# Patient Record
Sex: Female | Born: 1950 | Race: White | Hispanic: No
Health system: Midwestern US, Academic
[De-identification: ages and names within clinical notes are randomized; demographics above are authoritative.]

---

## 2016-12-11 ENCOUNTER — Ambulatory Visit: Admit: 2016-12-11 | Discharge: 2016-12-12 | Payer: MEDICARE | Primary: Family

## 2016-12-11 ENCOUNTER — Encounter: Admit: 2016-12-11 | Discharge: 2016-12-11 | Payer: MEDICARE | Primary: Family

## 2016-12-11 DIAGNOSIS — I10 Essential (primary) hypertension: ICD-10-CM

## 2016-12-11 DIAGNOSIS — E1122 Type 2 diabetes mellitus with diabetic chronic kidney disease: ICD-10-CM

## 2016-12-11 DIAGNOSIS — E669 Obesity, unspecified: Principal | ICD-10-CM

## 2016-12-11 DIAGNOSIS — IMO0001 IDDM (insulin dependent diabetes mellitus): ICD-10-CM

## 2016-12-11 DIAGNOSIS — R51 Headache: ICD-10-CM

## 2016-12-11 DIAGNOSIS — E785 Hyperlipidemia, unspecified: ICD-10-CM

## 2016-12-11 DIAGNOSIS — Z8659 Personal history of other mental and behavioral disorders: ICD-10-CM

## 2016-12-11 DIAGNOSIS — N289 Disorder of kidney and ureter, unspecified: ICD-10-CM

## 2016-12-11 DIAGNOSIS — R0789 Other chest pain: ICD-10-CM

## 2016-12-11 DIAGNOSIS — M109 Gout, unspecified: ICD-10-CM

## 2016-12-11 DIAGNOSIS — I4891 Unspecified atrial fibrillation: ICD-10-CM

## 2016-12-11 DIAGNOSIS — I4819 Other persistent atrial fibrillation: Principal | ICD-10-CM

## 2016-12-11 DIAGNOSIS — Z9889 Other specified postprocedural states: ICD-10-CM

## 2017-03-07 ENCOUNTER — Encounter: Admit: 2017-03-07 | Discharge: 2017-03-07 | Payer: MEDICARE | Primary: Family

## 2017-03-07 DIAGNOSIS — Z8659 Personal history of other mental and behavioral disorders: ICD-10-CM

## 2017-03-07 DIAGNOSIS — E785 Hyperlipidemia, unspecified: ICD-10-CM

## 2017-03-07 DIAGNOSIS — IMO0001 IDDM (insulin dependent diabetes mellitus): ICD-10-CM

## 2017-03-07 DIAGNOSIS — E669 Obesity, unspecified: Principal | ICD-10-CM

## 2017-03-07 DIAGNOSIS — Z9889 Other specified postprocedural states: ICD-10-CM

## 2017-03-07 DIAGNOSIS — M109 Gout, unspecified: ICD-10-CM

## 2017-03-07 DIAGNOSIS — I4891 Unspecified atrial fibrillation: ICD-10-CM

## 2017-03-07 DIAGNOSIS — R0789 Other chest pain: ICD-10-CM

## 2017-03-07 DIAGNOSIS — R51 Headache: ICD-10-CM

## 2017-03-07 DIAGNOSIS — I10 Essential (primary) hypertension: ICD-10-CM

## 2017-03-07 DIAGNOSIS — N289 Disorder of kidney and ureter, unspecified: ICD-10-CM

## 2017-03-08 ENCOUNTER — Encounter: Admit: 2017-03-08 | Discharge: 2017-03-08 | Payer: MEDICARE | Primary: Family

## 2017-03-08 DIAGNOSIS — E785 Hyperlipidemia, unspecified: ICD-10-CM

## 2017-03-08 DIAGNOSIS — IMO0001 IDDM (insulin dependent diabetes mellitus): ICD-10-CM

## 2017-03-08 DIAGNOSIS — N289 Disorder of kidney and ureter, unspecified: ICD-10-CM

## 2017-03-08 DIAGNOSIS — Z8659 Personal history of other mental and behavioral disorders: ICD-10-CM

## 2017-03-08 DIAGNOSIS — R51 Headache: ICD-10-CM

## 2017-03-08 DIAGNOSIS — Z9889 Other specified postprocedural states: ICD-10-CM

## 2017-03-08 DIAGNOSIS — I10 Essential (primary) hypertension: ICD-10-CM

## 2017-03-08 DIAGNOSIS — I4891 Unspecified atrial fibrillation: ICD-10-CM

## 2017-03-08 DIAGNOSIS — E669 Obesity, unspecified: Principal | ICD-10-CM

## 2017-03-08 DIAGNOSIS — M109 Gout, unspecified: ICD-10-CM

## 2017-03-08 DIAGNOSIS — R0789 Other chest pain: ICD-10-CM

## 2017-04-01 ENCOUNTER — Encounter: Admit: 2017-04-01 | Discharge: 2017-04-01 | Payer: MEDICARE | Primary: Family

## 2017-04-01 LAB — CBC
Lab: 4
Lab: 5.5

## 2017-04-08 ENCOUNTER — Encounter: Admit: 2017-04-08 | Discharge: 2017-04-08 | Payer: MEDICARE | Primary: Family

## 2017-04-08 DIAGNOSIS — R51 Headache: ICD-10-CM

## 2017-04-08 DIAGNOSIS — IMO0001 IDDM (insulin dependent diabetes mellitus): ICD-10-CM

## 2017-04-08 DIAGNOSIS — Z8659 Personal history of other mental and behavioral disorders: ICD-10-CM

## 2017-04-08 DIAGNOSIS — I4891 Unspecified atrial fibrillation: ICD-10-CM

## 2017-04-08 DIAGNOSIS — E785 Hyperlipidemia, unspecified: ICD-10-CM

## 2017-04-08 DIAGNOSIS — E669 Obesity, unspecified: Principal | ICD-10-CM

## 2017-04-08 DIAGNOSIS — M109 Gout, unspecified: ICD-10-CM

## 2017-04-08 DIAGNOSIS — N289 Disorder of kidney and ureter, unspecified: ICD-10-CM

## 2017-04-08 DIAGNOSIS — Z9889 Other specified postprocedural states: ICD-10-CM

## 2017-04-08 DIAGNOSIS — I10 Essential (primary) hypertension: ICD-10-CM

## 2017-04-08 DIAGNOSIS — R0789 Other chest pain: ICD-10-CM

## 2017-06-22 ENCOUNTER — Encounter: Admit: 2017-06-22 | Discharge: 2017-06-22 | Payer: MEDICARE | Primary: Adult Health

## 2017-06-25 ENCOUNTER — Ambulatory Visit: Admit: 2017-06-25 | Discharge: 2017-06-25 | Payer: MEDICARE | Primary: Family

## 2017-06-25 ENCOUNTER — Encounter: Admit: 2017-06-25 | Discharge: 2017-06-25 | Payer: MEDICARE | Primary: Adult Health

## 2017-06-25 DIAGNOSIS — E669 Obesity, unspecified: Principal | ICD-10-CM

## 2017-06-25 DIAGNOSIS — E785 Hyperlipidemia, unspecified: ICD-10-CM

## 2017-06-25 DIAGNOSIS — R0789 Other chest pain: ICD-10-CM

## 2017-06-25 DIAGNOSIS — I10 Essential (primary) hypertension: ICD-10-CM

## 2017-06-25 DIAGNOSIS — M109 Gout, unspecified: ICD-10-CM

## 2017-06-25 DIAGNOSIS — Z9889 Other specified postprocedural states: ICD-10-CM

## 2017-06-25 DIAGNOSIS — IMO0001 IDDM (insulin dependent diabetes mellitus): ICD-10-CM

## 2017-06-25 DIAGNOSIS — I4891 Unspecified atrial fibrillation: ICD-10-CM

## 2017-06-25 DIAGNOSIS — I4819 Other persistent atrial fibrillation: Principal | ICD-10-CM

## 2017-06-25 DIAGNOSIS — R51 Headache: ICD-10-CM

## 2017-06-25 DIAGNOSIS — N289 Disorder of kidney and ureter, unspecified: ICD-10-CM

## 2017-06-25 DIAGNOSIS — Z8659 Personal history of other mental and behavioral disorders: ICD-10-CM

## 2017-06-28 ENCOUNTER — Encounter: Admit: 2017-06-28 | Discharge: 2017-06-28 | Payer: MEDICARE | Primary: Family

## 2017-11-17 ENCOUNTER — Encounter: Admit: 2017-11-17 | Discharge: 2017-11-17 | Payer: MEDICARE | Primary: Family

## 2017-11-17 DIAGNOSIS — R51 Headache: ICD-10-CM

## 2017-11-17 DIAGNOSIS — N289 Disorder of kidney and ureter, unspecified: ICD-10-CM

## 2017-11-17 DIAGNOSIS — I4891 Unspecified atrial fibrillation: ICD-10-CM

## 2017-11-17 DIAGNOSIS — Z8659 Personal history of other mental and behavioral disorders: ICD-10-CM

## 2017-11-17 DIAGNOSIS — R0789 Other chest pain: ICD-10-CM

## 2017-11-17 DIAGNOSIS — IMO0001 IDDM (insulin dependent diabetes mellitus): ICD-10-CM

## 2017-11-17 DIAGNOSIS — Z9889 Other specified postprocedural states: ICD-10-CM

## 2017-11-17 DIAGNOSIS — M109 Gout, unspecified: ICD-10-CM

## 2017-11-17 DIAGNOSIS — E669 Obesity, unspecified: Principal | ICD-10-CM

## 2017-11-17 DIAGNOSIS — I10 Essential (primary) hypertension: ICD-10-CM

## 2017-11-17 DIAGNOSIS — E785 Hyperlipidemia, unspecified: ICD-10-CM

## 2017-12-06 ENCOUNTER — Encounter: Admit: 2017-12-06 | Discharge: 2017-12-06 | Payer: MEDICARE | Primary: Family

## 2017-12-06 ENCOUNTER — Ambulatory Visit: Admit: 2017-12-06 | Discharge: 2017-12-07 | Payer: MEDICARE | Primary: Family

## 2017-12-06 DIAGNOSIS — Z8659 Personal history of other mental and behavioral disorders: ICD-10-CM

## 2017-12-06 DIAGNOSIS — R0789 Other chest pain: ICD-10-CM

## 2017-12-06 DIAGNOSIS — E1122 Type 2 diabetes mellitus with diabetic chronic kidney disease: ICD-10-CM

## 2017-12-06 DIAGNOSIS — I10 Essential (primary) hypertension: ICD-10-CM

## 2017-12-06 DIAGNOSIS — IMO0001 IDDM (insulin dependent diabetes mellitus): ICD-10-CM

## 2017-12-06 DIAGNOSIS — I4819 Other persistent atrial fibrillation: Principal | ICD-10-CM

## 2017-12-06 DIAGNOSIS — E669 Obesity, unspecified: Principal | ICD-10-CM

## 2017-12-06 DIAGNOSIS — E785 Hyperlipidemia, unspecified: ICD-10-CM

## 2017-12-06 DIAGNOSIS — R51 Headache: ICD-10-CM

## 2017-12-06 DIAGNOSIS — N289 Disorder of kidney and ureter, unspecified: ICD-10-CM

## 2017-12-06 DIAGNOSIS — M109 Gout, unspecified: ICD-10-CM

## 2017-12-06 DIAGNOSIS — I4891 Unspecified atrial fibrillation: ICD-10-CM

## 2017-12-06 DIAGNOSIS — Z9889 Other specified postprocedural states: ICD-10-CM

## 2017-12-08 ENCOUNTER — Encounter: Admit: 2017-12-08 | Discharge: 2017-12-08 | Payer: MEDICARE | Primary: Adult Health

## 2017-12-08 ENCOUNTER — Encounter: Admit: 2017-12-08 | Discharge: 2017-12-08 | Payer: MEDICARE | Primary: Family

## 2017-12-13 ENCOUNTER — Encounter: Admit: 2017-12-13 | Discharge: 2017-12-13 | Payer: MEDICARE | Primary: Family

## 2017-12-13 DIAGNOSIS — E785 Hyperlipidemia, unspecified: ICD-10-CM

## 2017-12-13 DIAGNOSIS — R0789 Other chest pain: ICD-10-CM

## 2017-12-13 DIAGNOSIS — R51 Headache: ICD-10-CM

## 2017-12-13 DIAGNOSIS — I10 Essential (primary) hypertension: ICD-10-CM

## 2017-12-13 DIAGNOSIS — N289 Disorder of kidney and ureter, unspecified: ICD-10-CM

## 2017-12-13 DIAGNOSIS — E669 Obesity, unspecified: Principal | ICD-10-CM

## 2017-12-13 DIAGNOSIS — Z9889 Other specified postprocedural states: ICD-10-CM

## 2017-12-13 DIAGNOSIS — Z8659 Personal history of other mental and behavioral disorders: ICD-10-CM

## 2017-12-13 DIAGNOSIS — IMO0001 IDDM (insulin dependent diabetes mellitus): ICD-10-CM

## 2017-12-13 DIAGNOSIS — M109 Gout, unspecified: ICD-10-CM

## 2017-12-13 DIAGNOSIS — I4891 Unspecified atrial fibrillation: ICD-10-CM

## 2018-06-22 ENCOUNTER — Encounter: Admit: 2018-06-22 | Discharge: 2018-06-22 | Primary: Family

## 2018-06-22 NOTE — Telephone Encounter
-----   Message from Louis Meckel, LPN sent at 9/32/3557  1:27 PM CDT -----  Regarding: YMR- having surgery  VM from patient on triage line.  1- she is having cataract surgery next month.  Is there any medications she needs to hold?  2- her PCP would like to put her on Prolia for bone density.  Is it safe for her to take?  Call her at [phone].

## 2018-06-22 NOTE — Telephone Encounter
Called and spoke with patient. Patient stated they will see their eye doctor on 6/20 for workup for cataract surgery. Wanting to know how long ok to hold blood thinner. Informed to patient to have their office send [place] a medication hold form if it is needed. Gave patient our fax number to send request to. Told patient we would confirm on our end if you have to hold blood thinners what the appropriate time frame would be. Patient also asking about starting on medication prolia. Patient concerned due to side effect reporting risk of infection to the heart. Told patient to tell Dr. Reece Levy at next [place] on 7/29. Patient verbalized understanding. No further questions.

## 2018-06-23 NOTE — Telephone Encounter
Tina Gerold, APRN-NP  [place], [place], BSN            Should be fine as long as no hx TIA/CVA.     Thanks,   Tina Bush    Previous Messages      ----- Message -----   From: Tina Bush, BSN   Sent: 06/22/2018  2:47 PM CDT   To: Tina Bush Ep App   Subject: Medication hold for Blood thinner           Patient will be having Cataract Surgery next month. Patient unsure of procedure yet.     Patient is currently on Xarelto for PAF.   At his last OV, his heart rhythm was NSR, OV 12/06/2017 with YMR gave Cardiac clearance and med hold clearance for knee surgery.   Chads2vasc = 4 (HTN, Age, Gender, DM).   In past, patient has been OK to hold Xarelto without bridge for 48 hours.      Is patient OK to hold Xarelto for 2 days?   Does patient need to bridge?

## 2018-07-25 ENCOUNTER — Encounter: Admit: 2018-07-25 | Discharge: 2018-07-25 | Primary: Family

## 2018-08-03 ENCOUNTER — Encounter: Admit: 2018-08-03 | Discharge: 2018-08-03 | Primary: Family

## 2018-08-03 ENCOUNTER — Ambulatory Visit: Admit: 2018-08-03 | Discharge: 2018-08-04 | Primary: Family

## 2018-08-03 DIAGNOSIS — I480 Paroxysmal atrial fibrillation: Secondary | ICD-10-CM

## 2018-08-03 DIAGNOSIS — Z9889 Other specified postprocedural states: Secondary | ICD-10-CM

## 2018-08-03 DIAGNOSIS — E785 Hyperlipidemia, unspecified: Secondary | ICD-10-CM

## 2018-08-03 DIAGNOSIS — E669 Obesity, unspecified: Secondary | ICD-10-CM

## 2018-08-03 DIAGNOSIS — N289 Disorder of kidney and ureter, unspecified: Secondary | ICD-10-CM

## 2018-08-03 DIAGNOSIS — I4891 Unspecified atrial fibrillation: Secondary | ICD-10-CM

## 2018-08-03 DIAGNOSIS — R0789 Other chest pain: Secondary | ICD-10-CM

## 2018-08-03 DIAGNOSIS — R51 Headache: Secondary | ICD-10-CM

## 2018-08-03 DIAGNOSIS — I10 Essential (primary) hypertension: Secondary | ICD-10-CM

## 2018-08-03 DIAGNOSIS — IMO0001 IDDM (insulin dependent diabetes mellitus): Secondary | ICD-10-CM

## 2018-08-03 DIAGNOSIS — M109 Gout, unspecified: Secondary | ICD-10-CM

## 2018-08-03 DIAGNOSIS — Z8659 Personal history of other mental and behavioral disorders: Secondary | ICD-10-CM

## 2018-08-03 DIAGNOSIS — I4819 Other persistent atrial fibrillation: Secondary | ICD-10-CM

## 2018-08-03 DIAGNOSIS — E6609 Other obesity due to excess calories: Secondary | ICD-10-CM

## 2018-08-03 NOTE — Patient Instructions
Things look great today - no changes.  Return to see Dr. Reece Levy in 1 year.  Call us sooner for new or worsening symptoms

## 2018-08-03 NOTE — Progress Notes
Date of Service: 08/03/2018    Tina Bush is a 68 y.o. female.       HPI  Tina Bush was seen in the office today in electrophysiology follow up. As you may know, she is a 68 y.o. female, with past medical history including pretension, hyperlipidemia, diabetes mellitus type 2, obesity and persistent atrial fibrillation.  She did undergo pulmonary vein isolation with Dr. Wyona Almas in 2016 and she did require redo ovarian vein isolation and cavotricuspid isthmus ablation in 2017.  Since that time she has done well without known arrhythmia recurrence off of antiarrhythmic therapy.  She remains on  anticoagulation in the form of Xarelto given her CHA2DS2-VASc Score of 4 (age, female, DM, HTN).  She last saw Dr. Betti Cruz in December prior to undergoing knee replacement surgery.  She presents today for routine follow-up.    [name] reports feeling well since her last visit.  She did undergo knee replacement surgery earlier this year without complications.  She denies any known recurrence of atrial arrhythmias, however states that she does not typically feel her symptoms when out of rhythm.  She states that she is followed her primary care provider on a monthly basis.  She denies any exertional dyspnea or chest discomfort.  She denies dizziness, lightheadedness, syncope or near syncope.    __________________________________________________________________________________  DISEASE PREVENTION:   Lipids/Statin treatment:  yes.   Currently on atorvastatin   for Goal LDL < 70, Triglycerides < 161 mg/dl.     HTN: yes.  Optimally controlled on current medical therapy.  Goal Systolic < 130, diastolic < 90.       Diabetes:  yes.  Follows with PCP for goal A1c < 6.0    Tobacco: Denies          Obesity/Nutrition/Physical Activity: BMI 35  __________________________________________________________________________________      Vitals:    08/03/18 1506 08/03/18 1524   BP: 122/84 128/86 BP Source: Arm, Left Upper Arm, Right Upper   Pulse: 64    Temp: 36.6 ???C (97.9 ???F)    SpO2: 97%    Weight: 94.6 kg (208 lb 8 oz)    Height: 1.651 m (5' 5)    PainSc: Zero      Body mass index is 34.7 kg/m???.     Past Medical History  Patient Active Problem List    Diagnosis Date Noted   ??? S/P ablation of atrial fibrillation 08/03/2018   ??? Atrial fibrillation (HCC) 05/31/2015   ??? Obesity 02/25/2012   ??? Diabetes mellitus (HCC) 02/25/2012   ??? Dyslipidemia 02/25/2012   ??? Chest pressure 02/25/2012   ??? Persistent atrial fibrillation (HCC) 02/25/2012         Review of Systems   Constitution: Negative.   HENT: Negative.    Eyes: Positive for blurred vision.   Cardiovascular: Positive for dyspnea on exertion and leg swelling.   Respiratory: Positive for snoring.    Endocrine: Positive for polyuria.   Hematologic/Lymphatic: Negative.    Skin: Negative.    Musculoskeletal: Positive for arthritis, joint pain, joint swelling, muscle cramps and stiffness.   Gastrointestinal: Negative.    Genitourinary: Positive for frequency.        Kidney disease   Neurological: Positive for headaches.   Psychiatric/Behavioral: Positive for depression. The patient has insomnia.    Allergic/Immunologic: Positive for environmental allergies.       Physical Exam   Constitutional: She appears well-developed and well-nourished.   HENT:   Head: Normocephalic and  atraumatic.   Eyes: Conjunctivae are normal.   Neck:   Soft carotid bruit auscultated on the left   Cardiovascular: Normal rate, regular rhythm and normal heart sounds.   Pulmonary/Chest: Effort normal and breath sounds normal.   Musculoskeletal:         General: No edema.   Neurological: She is alert and oriented to person, place, and time.   Skin: Skin is warm and dry.   Psychiatric: She has a normal mood and affect. Her behavior is normal.     Cardiovascular Studies  ECG in office today shows normal sinus rhythm at 53 bpm with right bundle branch block.  QRS duration estimated near 130 ms Problems Addressed Today  Encounter Diagnoses   Name Primary?   ??? PAF (paroxysmal atrial fibrillation) (HCC) Yes   ??? Persistent atrial fibrillation (HCC)    ??? Other persistent atrial fibrillation (HCC)    ??? Class 1 obesity due to excess calories without serious comorbidity with body mass index (BMI) of 34.0 to 34.9 in adult    ??? S/P ablation of atrial fibrillation        Assessment and Plan  Ms. Tina Bush appears to be doing well since her last visit 6 months ago.  She underwent knee replacement surgery without any known arrhythmia recurrence.  She remains off of antiarrhythmic drug therapy but continues on anticoagulation given her CHA2DS2-VASc Score of 4.  She does plan to undergo eye surgery in the near future and I told her that it would be fine for her to come off of anticoagulation for several days before and after procedure without bridging.    She does have a soft left carotid bruit upon auscultation in the office today.  She states that her primary provider is aware and that she has had a carotid ultrasound done [facility] in the past revealing no significant stenosis.  I have asked that she continue to follow with them in regards to this.    I have arranged for her to see Dr. Betti Cruz back in 1 years time barring any changes in the interim.  I have asked that she contact us sooner for worsening of symptoms or new complaints.  She verbalized good understanding and agreement with this plan.    Jene Every, NP-C     Current Medications (including today's revisions)  ??? allopurinol (ZYLOPRIM) 100 mg tablet Take 100 mg by mouth daily.   ??? atenolol (TENORMIN) 25 mg tablet Take 25 mg by mouth daily.   ??? atorvastatin (LIPITOR) 40 mg tablet Take 40 mg by mouth daily.   ??? biotin 1 mg cap Take 10,000 mg by mouth daily.   ??? CALCIUM CARBONATE/VITAMIN D2 (CALCIUM + VITAMIN D PO) Take 2 Tabs by mouth daily.   ??? cholecalciferol (VITAMIN D3) 50 mcg (2,000 unit) tablet Take 2,000 Units by mouth daily. ??? denosumab (PROLIA) 60 mg/mL syrg Inject 60 mg under the skin every 180 days.   ??? fexofenadine (ALLEGRA) 180 mg tablet Take 180 mg by mouth daily.   ??? fluoxetine (PROZAC) 20 mg capsule Take 20 mg by mouth daily.   ??? furosemide (LASIX) 20 mg tablet Take 20 mg by mouth every morning.   ??? Gelatin 650 mg cap Take 2 Caps by mouth daily.   ??? glipiZIDE (GLUCOTROL) 5 mg tablet Take 2.5 mg by mouth daily.   ??? hydroCHLOROthiazide (HYDRODIURIL) 12.5 mg tablet Take 12.5 mg by mouth daily.   ??? lisinopril (PRINIVIL, ZESTRIL) 40 mg tablet Take 40 mg  by mouth daily.   ??? rivaroxaban (XARELTO) 20 mg tab tablet Take 20 mg by mouth at bedtime daily.   ??? vitamin E 400 unit capsule Take 400 Units by mouth daily.   ??? vitamins, multiple tablet Take 1 Tab by mouth daily.

## 2018-08-04 DIAGNOSIS — Z9889 Other specified postprocedural states: Secondary | ICD-10-CM

## 2018-08-04 DIAGNOSIS — Z6834 Body mass index (BMI) 34.0-34.9, adult: Secondary | ICD-10-CM

## 2019-04-14 ENCOUNTER — Encounter: Admit: 2019-04-14 | Discharge: 2019-04-14 | Payer: MEDICARE | Primary: Family

## 2019-04-14 NOTE — Telephone Encounter
Called patient.   Scheduled her to see Dr. Reece Levy.   She also wanted to let us know that she got her first COVID vaccine yesterday.

## 2019-04-14 NOTE — Telephone Encounter
-----   Message from Louis Meckel, LPN sent at [id number]  3:49 PM CDT -----  Regarding: YMR- update her med list and make appointment. Call her at [phone].

## 2019-04-14 NOTE — Telephone Encounter
-----   Message from Louis Meckel, LPN sent at [id number]  3:49 PM CDT -----  Regarding: YMR- update her med list and make appointment. Call her at [phone].

## 2019-06-30 ENCOUNTER — Encounter: Admit: 2019-06-30 | Discharge: 2019-06-30 | Payer: MEDICARE | Primary: Family

## 2019-06-30 NOTE — Progress Notes
Please send the following records for continuity of care.     Most recent lipid and liver profile, chemistry panel, and thyroid panel.     Please fax to:  Attention    Fax: [phone]    Thank you,     [facility]  Cardiovascular Medicine  [address] [place] [address]  Phone: [phone]  Fax: [phone]

## 2019-07-07 ENCOUNTER — Encounter: Admit: 2019-07-07 | Discharge: 2019-07-07 | Payer: MEDICARE | Primary: Family

## 2019-07-07 ENCOUNTER — Ambulatory Visit: Admit: 2019-07-07 | Discharge: 2019-07-07 | Payer: MEDICARE | Primary: Family

## 2019-07-07 DIAGNOSIS — R519 Generalized headache: Secondary | ICD-10-CM

## 2019-07-07 DIAGNOSIS — M109 Gout, unspecified: Secondary | ICD-10-CM

## 2019-07-07 DIAGNOSIS — I4891 Unspecified atrial fibrillation: Secondary | ICD-10-CM

## 2019-07-07 DIAGNOSIS — IMO0001 IDDM (insulin dependent diabetes mellitus): Secondary | ICD-10-CM

## 2019-07-07 DIAGNOSIS — I4819 Other persistent atrial fibrillation: Secondary | ICD-10-CM

## 2019-07-07 DIAGNOSIS — I10 Essential (primary) hypertension: Secondary | ICD-10-CM

## 2019-07-07 DIAGNOSIS — R0789 Other chest pain: Secondary | ICD-10-CM

## 2019-07-07 DIAGNOSIS — E785 Hyperlipidemia, unspecified: Secondary | ICD-10-CM

## 2019-07-07 DIAGNOSIS — Z9889 Other specified postprocedural states: Secondary | ICD-10-CM

## 2019-07-07 DIAGNOSIS — E669 Obesity, unspecified: Secondary | ICD-10-CM

## 2019-07-07 DIAGNOSIS — Z8659 Personal history of other mental and behavioral disorders: Secondary | ICD-10-CM

## 2019-07-07 DIAGNOSIS — N289 Disorder of kidney and ureter, unspecified: Secondary | ICD-10-CM

## 2019-07-07 NOTE — Patient Instructions
Follow up in 8 months.     Consider signing up for My Chart.    In order to provide you the best care possible we ask that you follow up as below:    For NON-URGENT questions please contact us through your MyChart account.   For all medication refills please contact your pharmacy or send a request through MyChart.   For all questions that may need to be addressed urgently please call the nursing triage line at [phone] Monday - Friday 8-5 only. Please leave a detailed message with your name, date of birth, and reason for your call.      To schedule an appointment call [phone].     Please allow 10 business days for the results of any testing to be reviewed. Please call our office if you have not heard from a nurse within this time frame.

## 2019-07-30 ENCOUNTER — Encounter: Admit: 2019-07-30 | Discharge: 2019-07-30 | Payer: MEDICARE | Primary: Family

## 2019-07-30 DIAGNOSIS — E785 Hyperlipidemia, unspecified: Secondary | ICD-10-CM

## 2019-07-30 DIAGNOSIS — Z9889 Other specified postprocedural states: Secondary | ICD-10-CM

## 2019-07-30 DIAGNOSIS — Z8659 Personal history of other mental and behavioral disorders: Secondary | ICD-10-CM

## 2019-07-30 DIAGNOSIS — E669 Obesity, unspecified: Secondary | ICD-10-CM

## 2019-07-30 DIAGNOSIS — N289 Disorder of kidney and ureter, unspecified: Secondary | ICD-10-CM

## 2019-07-30 DIAGNOSIS — R519 Generalized headache: Secondary | ICD-10-CM

## 2019-07-30 DIAGNOSIS — R0789 Other chest pain: Secondary | ICD-10-CM

## 2019-07-30 DIAGNOSIS — I4891 Unspecified atrial fibrillation: Secondary | ICD-10-CM

## 2019-07-30 DIAGNOSIS — I10 Essential (primary) hypertension: Secondary | ICD-10-CM

## 2019-07-30 DIAGNOSIS — M109 Gout, unspecified: Secondary | ICD-10-CM

## 2019-07-30 DIAGNOSIS — IMO0001 IDDM (insulin dependent diabetes mellitus): Secondary | ICD-10-CM

## 2019-07-31 ENCOUNTER — Encounter: Admit: 2019-07-31 | Discharge: 2019-07-31 | Payer: MEDICARE | Primary: Family

## 2019-07-31 DIAGNOSIS — Z9889 Other specified postprocedural states: Secondary | ICD-10-CM

## 2019-07-31 DIAGNOSIS — M109 Gout, unspecified: Secondary | ICD-10-CM

## 2019-07-31 DIAGNOSIS — IMO0001 IDDM (insulin dependent diabetes mellitus): Secondary | ICD-10-CM

## 2019-07-31 DIAGNOSIS — I4891 Unspecified atrial fibrillation: Secondary | ICD-10-CM

## 2019-07-31 DIAGNOSIS — E785 Hyperlipidemia, unspecified: Secondary | ICD-10-CM

## 2019-07-31 DIAGNOSIS — I10 Essential (primary) hypertension: Secondary | ICD-10-CM

## 2019-07-31 DIAGNOSIS — R519 Generalized headache: Secondary | ICD-10-CM

## 2019-07-31 DIAGNOSIS — N289 Disorder of kidney and ureter, unspecified: Secondary | ICD-10-CM

## 2019-07-31 DIAGNOSIS — E669 Obesity, unspecified: Secondary | ICD-10-CM

## 2019-07-31 DIAGNOSIS — Z8659 Personal history of other mental and behavioral disorders: Secondary | ICD-10-CM

## 2019-07-31 DIAGNOSIS — R0789 Other chest pain: Secondary | ICD-10-CM

## 2019-11-15 ENCOUNTER — Encounter: Admit: 2019-11-15 | Discharge: 2019-11-15 | Payer: MEDICARE | Primary: Family

## 2019-11-15 NOTE — Telephone Encounter
-----   Message from Thomasene Mohair, RN sent at 11/15/2019  1:59 PM CST -----  Regarding: YMR -- CC Request in [place] and [facility]

## 2019-11-15 NOTE — Telephone Encounter
Patient is scheduled for septoplasty and open reduction of nasal fracture under general anesthesia on 12/6 with Dr. Mateo Flow, cardiac clearance has been requested.     Patient was last seen on 07/07/19.    Patient has a history of DM, HL, PAF,  Last stress test was on 2014.  Last ECHO was on 2015.      He is currently able to walk two blocks and up one flight of stairs.      Dr.  is requesting a 3 day hold of xarelto      Patient is currently on xarelto for PAF.  At his last OV, his heart rhythm was NSR,Chadsvasc = 3(age, gender, DM).       Is patient OK to hold xarelto for 3 days?  Does patient need to bridge?      Fax clearance to [phone] attn [facility]

## 2019-12-07 ENCOUNTER — Encounter: Admit: 2019-12-07 | Discharge: 2019-12-07 | Payer: MEDICARE | Primary: Family

## 2019-12-07 NOTE — Telephone Encounter
Patient is scheduled for Septoplasty and Open Reduction Nasal -Fracture under general anesthesia on 12/11/2019 with Dr. Mia Creek (F [phone]) , cardiac clearance and 3 day hold of Xarelto has been requested.     Patient was last seen on 07/07/2019. At his last OV, his heart rhythm was sinus rhythm and aflutter  .     Patient has a history of HF, HLD, HTN and Obesity with hx of RF afib ablation (on Xarelto) with Dr. Delight Hoh = 4 for HTN, age, diabetes and renal insufficiency     Will review with YMR in clinic.

## 2019-12-07 NOTE — Telephone Encounter
Per YMR, [place] for cardiac clearance and to HOLD Xarelto for 3 days prior to nasal procedure on 12/07/19    Cardiac clearance and faxed to below

## 2020-08-20 ENCOUNTER — Encounter: Admit: 2020-08-20 | Discharge: 2020-08-20 | Payer: MEDICARE | Primary: Family

## 2020-08-20 NOTE — Progress Notes
Please send the following records for continuity of care:    Most recent lipid and liver profile, chemistry panel, and thyroid panel.      Please fax to: [facility] cardiology -  Fax - [place] 14-Aug-1950        [place],                                Tawny Hopping               [phone]

## 2020-12-10 ENCOUNTER — Encounter: Admit: 2020-12-10 | Discharge: 2020-12-10 | Payer: MEDICARE | Primary: Family

## 2020-12-10 ENCOUNTER — Ambulatory Visit: Admit: 2020-12-10 | Discharge: 2020-12-10 | Payer: MEDICARE | Primary: Family

## 2020-12-10 DIAGNOSIS — R519 Generalized headache: Secondary | ICD-10-CM

## 2020-12-10 DIAGNOSIS — Z136 Encounter for screening for cardiovascular disorders: Secondary | ICD-10-CM

## 2020-12-10 DIAGNOSIS — R0789 Other chest pain: Secondary | ICD-10-CM

## 2020-12-10 DIAGNOSIS — M109 Gout, unspecified: Secondary | ICD-10-CM

## 2020-12-10 DIAGNOSIS — I4891 Unspecified atrial fibrillation: Secondary | ICD-10-CM

## 2020-12-10 DIAGNOSIS — Z9889 Other specified postprocedural states: Secondary | ICD-10-CM

## 2020-12-10 DIAGNOSIS — E785 Hyperlipidemia, unspecified: Secondary | ICD-10-CM

## 2020-12-10 DIAGNOSIS — E6609 Other obesity due to excess calories: Secondary | ICD-10-CM

## 2020-12-10 DIAGNOSIS — Z8659 Personal history of other mental and behavioral disorders: Secondary | ICD-10-CM

## 2020-12-10 DIAGNOSIS — IMO0001 IDDM (insulin dependent diabetes mellitus): Secondary | ICD-10-CM

## 2020-12-10 DIAGNOSIS — E1122 Type 2 diabetes mellitus with diabetic chronic kidney disease: Secondary | ICD-10-CM

## 2020-12-10 DIAGNOSIS — I10 Essential (primary) hypertension: Secondary | ICD-10-CM

## 2020-12-10 DIAGNOSIS — I4510 Unspecified right bundle-branch block: Secondary | ICD-10-CM

## 2020-12-10 DIAGNOSIS — N289 Disorder of kidney and ureter, unspecified: Secondary | ICD-10-CM

## 2020-12-10 DIAGNOSIS — I4819 Other persistent atrial fibrillation: Secondary | ICD-10-CM

## 2020-12-10 DIAGNOSIS — E669 Obesity, unspecified: Secondary | ICD-10-CM

## 2020-12-10 NOTE — Progress Notes
Date of Service: 12/10/2020    Tina Bush is a 70 y.o. female.       HPI  Tina Bush was seen in the office today in electrophysiology follow up. As you may know, she is a 70 y.o. female, with past medical history including hypertension, hyperlipidemia, diabetes mellitus type 2, obesity, right bundle branch block and persistent atrial fibrillation.  She did undergo pulmonary vein isolation with Dr. Leeann Must in 2016 and did well post ablation without recurrence of arrhythmia until after dofetilide was discontinued.  She did require redo pulmonary vein isolation in 2017 and has done well since that time off of antiarrhythmic drug therapy without known recurrence.  She has been maintained on Xarelto 20 mg daily given her CHA2DS2-VASc score of 4 (age, female, diabetes mellitus, hypertension).  She last saw Dr. Betti Cruz  in July 2021.  She presents today for routine follow-up.    [name] reports doing well since her last visit.  She did suffer a trip and fall resulting in a fractured nose requiring surgery in November 2021.  Outside of that she has not experienced any injuries or falls.  She denies dizziness or lightheadedness.  She does check her blood pressure routinely at home and has not been flagged for any irregular rapid heart rate.  She has not experienced any palpitations or fluttering.  She remains on anticoagulation and denies signs or symptoms of bleeding.  She has established primary care with a new provider who is working on blood pressure.    Vitals:    12/10/20 1413   BP: 138/76   BP Source: Arm, Right Upper   Pulse: 65   SpO2: 98%   PainSc: Zero   Weight: 100.2 kg (221 lb)   Height: 160 cm (5' 3)     Body mass index is 39.15 kg/m?[name]     Past Medical History  Patient Active Problem List    Diagnosis Date Noted   ? RBBB 12/10/2020   ? S/P ablation of atrial fibrillation 08/03/2018   ? Atrial fibrillation (HCC) 05/31/2015   ? Obesity 02/25/2012   ? Diabetes mellitus (HCC) 02/25/2012   ? Dyslipidemia 02/25/2012   ? Chest pressure 02/25/2012   ? Persistent atrial fibrillation (HCC) 02/25/2012         Review of Systems   Constitutional: Negative.   HENT: Negative.    Eyes: Negative.    Cardiovascular: Negative.    Respiratory: Negative.    Endocrine: Negative.    Hematologic/Lymphatic: Negative.    Skin: Negative.    Musculoskeletal: Negative.    Gastrointestinal: Negative.    Genitourinary: Negative.    Neurological: Negative.    Psychiatric/Behavioral: Negative.    Allergic/Immunologic: Negative.        Physical Exam   Constitutional: She appears well-developed and well-nourished.   obese   HENT:   Head: Normocephalic and atraumatic.   Eyes: Conjunctivae are normal.   Cardiovascular: Normal rate, regular rhythm and normal heart sounds.   Pulmonary/Chest: Effort normal and breath sounds normal.   Musculoskeletal:         General: Edema present.      Comments: Mild dependent bipedal edema without pitting   Neurological: She is alert and oriented to person, place, and time.   Skin: Skin is warm and dry.   Psychiatric: She has a normal mood and affect. Her behavior is normal.         Cardiovascular Studies  ECG in office today shows sinus  rhythm at 62 bpm with right bundle branch block, QRS duration estimated near 125 ms.    Cardiovascular Health Factors  Vitals BP Readings from Last 3 Encounters:   12/10/20 138/76   07/07/19 124/68   08/03/18 128/86     Wt Readings from Last 3 Encounters:   12/10/20 100.2 kg (221 lb)   07/07/19 105.7 kg (233 lb)   08/03/18 94.6 kg (208 lb 8 oz)     BMI Readings from Last 3 Encounters:   12/10/20 39.15 kg/m?   07/07/19 38.77 kg/m?   08/03/18 34.70 kg/m?      Smoking Social History     Tobacco Use   Smoking Status Former   ? Packs/day: 1.00   ? Years: 17.00   ? Pack years: 17.00   ? Types: Cigarettes   ? Quit date: 02/28/2004   ? Years since quitting: 16.7   Smokeless Tobacco Never      Lipid Profile Cholesterol   Date Value Ref Range Status   07/19/2019 145  Final HDL   Date Value Ref Range Status   07/19/2019 41  Final     LDL   Date Value Ref Range Status   07/19/2019 77  Final     Triglycerides   Date Value Ref Range Status   07/19/2019 158 (H) 0 - 149 Final      Blood Sugar Hemoglobin A1C   Date Value Ref Range Status   03/31/2017 5.5  Final     Glucose   Date Value Ref Range Status   07/19/2019 142 (H) 65 - 99 Final   07/22/2018 130 (H) 65 - 99 Final   03/31/2017 98  Final     Glucose, POC   Date Value Ref Range Status   06/01/2015 105 (H) 70 - 100 MG/DL Final   45/40/9811 914 (H) 70 - 100 MG/DL Final   78/29/5621 308 (H) 70 - 100 MG/DL Final          Problems Addressed Today  Encounter Diagnoses   Name Primary?   ? Screening for heart disease Yes   ? Persistent atrial fibrillation (HCC)    ? S/P ablation of atrial fibrillation    ? Type 2 diabetes mellitus with chronic kidney disease, without long-term current use of insulin, unspecified CKD stage (HCC)    ? Dyslipidemia    ? Class 1 obesity due to excess calories without serious comorbidity with body mass index (BMI) of 34.0 to 34.9 in adult    ? RBBB        Assessment and Plan  [name] appears to be doing well since her last visit.  She has not had any known recurrence of atrial arrhythmias.  She has establish care with a new primary care provider who is working on her blood pressure control.  She remains on anticoagulation and denies signs or symptoms of bleeding.  Have not made any adjustments to her regimen at this time.  I have asked that she return to see Dr. Betti Cruz in 1 years time and contact us sooner for worsening of symptoms or new complaints.  She verbalized good understanding and agreement with this plan.    Jene Every, NP-C     Current Medications (including today's revisions)  ? allopurinol (ZYLOPRIM) 100 mg tablet Take 100 mg by mouth daily.   ? ascorbic acid (VITAMIN C PO) Take 1,000 mg by mouth daily.   ? atenolol (TENORMIN) 25 mg tablet Take 25 mg by mouth daily.   ?  atorvastatin (LIPITOR) 40 mg tablet Take 40 mg by mouth daily.   ? biotin 1 mg cap Take 1,000 mg by mouth daily.   ? brexpiprazole (REXULTI) 0.5 mg tablet Take 0.5 mg by mouth daily.   ? CALCIUM CARBONATE/VITAMIN D2 (CALCIUM + VITAMIN D PO) Take 2 Tabs by mouth daily.   ? cholecalciferol (VITAMIN D3) 50 mcg (2,000 unit) tablet Take 2,000 Units by mouth daily.   ? denosumab (PROLIA) 60 mg/mL syrg Inject 60 mg under the skin every 180 days.   ? fluoxetine (PROZAC) 20 mg capsule Take 30 mg by mouth daily.   ? furosemide (LASIX) 20 mg tablet Take 20 mg by mouth every morning.   ? glipiZIDE (GLUCOTROL) 5 mg tablet Take 2.5 mg by mouth daily.   ? hydroCHLOROthiazide (HYDRODIURIL) 12.5 mg tablet Take 12.5 mg by mouth daily.   ? lisinopril (PRINIVIL, ZESTRIL) 40 mg tablet Take 40 mg by mouth daily.   ? loratadine (CLARITIN) 10 mg tablet Take 10 mg by mouth every morning.   ? other medication Take 1 Dose by mouth once. Medication Name & Strength: [place]   Dose(how many): 1 cap   Frequency(how often): BID   ? rivaroxaban (XARELTO) 20 mg tablet Take 20 mg by mouth at bedtime daily.   ? vitamin E 400 unit capsule Take 400 Units by mouth daily.   ? vitamins, multiple tablet Take 1 Tab by mouth daily.

## 2021-04-03 ENCOUNTER — Encounter: Admit: 2021-04-03 | Discharge: 2021-04-03 | Payer: MEDICARE | Primary: Family

## 2021-04-03 DIAGNOSIS — R69 Illness, unspecified: Secondary | ICD-10-CM

## 2021-04-04 ENCOUNTER — Encounter: Admit: 2021-04-04 | Discharge: 2021-04-04 | Payer: MEDICARE | Primary: Family

## 2021-04-12 ENCOUNTER — Encounter: Admit: 2021-04-12 | Discharge: 2021-04-12 | Payer: MEDICARE | Primary: Family

## 2021-04-16 ENCOUNTER — Encounter: Admit: 2021-04-16 | Discharge: 2021-04-16 | Payer: MEDICARE | Primary: Family

## 2021-04-16 ENCOUNTER — Ambulatory Visit: Admit: 2021-04-16 | Discharge: 2021-04-17 | Payer: MEDICARE | Primary: Family

## 2021-04-16 VITALS — BP 130/48 | HR 69 | Ht 62.0 in | Wt 223.0 lb

## 2021-04-16 DIAGNOSIS — R519 Generalized headache: Secondary | ICD-10-CM

## 2021-04-16 DIAGNOSIS — D352 Benign neoplasm of pituitary gland: Principal | ICD-10-CM

## 2021-04-16 DIAGNOSIS — R0789 Other chest pain: Secondary | ICD-10-CM

## 2021-04-16 DIAGNOSIS — M109 Gout, unspecified: Secondary | ICD-10-CM

## 2021-04-16 DIAGNOSIS — N289 Disorder of kidney and ureter, unspecified: Secondary | ICD-10-CM

## 2021-04-16 DIAGNOSIS — E669 Obesity, unspecified: Secondary | ICD-10-CM

## 2021-04-16 DIAGNOSIS — Z9889 Other specified postprocedural states: Secondary | ICD-10-CM

## 2021-04-16 DIAGNOSIS — E785 Hyperlipidemia, unspecified: Secondary | ICD-10-CM

## 2021-04-16 DIAGNOSIS — Z8659 Personal history of other mental and behavioral disorders: Secondary | ICD-10-CM

## 2021-04-16 DIAGNOSIS — I10 Essential (primary) hypertension: Secondary | ICD-10-CM

## 2021-04-16 DIAGNOSIS — I4891 Unspecified atrial fibrillation: Secondary | ICD-10-CM

## 2021-04-16 DIAGNOSIS — IMO0001 IDDM (insulin dependent diabetes mellitus): Secondary | ICD-10-CM

## 2021-07-28 ENCOUNTER — Encounter: Admit: 2021-07-28 | Discharge: 2021-07-28 | Payer: MEDICARE | Primary: Family

## 2021-07-28 NOTE — Progress Notes
Zengotita, Melanie, RN  Zengotita, Melanie, RN; Cambria, Aicia Babinski, BSN  Dr. Bobetta Lime wanting patient to follow up in December with MRI to be done at [facility] prior to appointment.       Call placed to pt. Two patient identifiers used for patient safety. Informed to set up upcoming MRI during the first week of December 2023. Once scheduled, she should call clinic to set up follow up appointment with Dr.Milligan. Tina Bush was grateful of call and will call clinic back once imaging is set up at [facility].     Order and pt. demographics faxed. Fax confirmation received.

## 2021-09-10 ENCOUNTER — Encounter: Admit: 2021-09-10 | Discharge: 2021-09-10 | Payer: MEDICARE | Primary: Family

## 2021-11-13 ENCOUNTER — Encounter: Admit: 2021-11-13 | Discharge: 2021-11-13 | Payer: MEDICARE | Primary: Family

## 2021-12-17 ENCOUNTER — Encounter: Admit: 2021-12-17 | Discharge: 2021-12-17 | Payer: MEDICARE | Primary: Family

## 2021-12-17 ENCOUNTER — Ambulatory Visit: Admit: 2021-12-17 | Discharge: 2021-12-17 | Payer: MEDICARE | Primary: Family

## 2021-12-17 DIAGNOSIS — D352 Benign neoplasm of pituitary gland: Secondary | ICD-10-CM

## 2021-12-17 NOTE — Patient Instructions
Thank you for allowing [place] to participate in your care today!    Dr.Milligan would like to follow up with you in 18 months with a new MRI performed in [place].    If you require imaging that you would like to be performed in a facility outside of [place] prior to your next appointment, please contact our office and confirm which location you would like via MyChart or phone call at least one month prior to your appointment so we may precert and coordinate care in an efficient manner.     To schedule or change an appointment with Dr.Milligan please call [phone] and choose option 1.  To schedule or change an appointment with our radiology department call  [phone].  To get in contact with his nurse you may call [phone].   To fax FMLA/ records [phone]) - [phone].

## 2022-03-17 ENCOUNTER — Encounter: Admit: 2022-03-17 | Discharge: 2022-03-17 | Payer: MEDICARE | Primary: Family

## 2022-05-30 ENCOUNTER — Encounter: Admit: 2022-05-30 | Discharge: 2022-05-30 | Payer: MEDICARE | Primary: Family

## 2022-06-11 ENCOUNTER — Encounter: Admit: 2022-06-11 | Discharge: 2022-06-11 | Payer: MEDICARE | Primary: Family

## 2022-06-24 ENCOUNTER — Encounter: Admit: 2022-06-24 | Discharge: 2022-06-24 | Payer: MEDICARE | Primary: Family

## 2022-06-24 ENCOUNTER — Ambulatory Visit: Admit: 2022-06-24 | Discharge: 2022-06-24 | Payer: MEDICARE | Primary: Family

## 2022-06-24 DIAGNOSIS — E785 Hyperlipidemia, unspecified: Secondary | ICD-10-CM

## 2022-06-24 DIAGNOSIS — M109 Gout, unspecified: Secondary | ICD-10-CM

## 2022-06-24 DIAGNOSIS — R519 Generalized headache: Secondary | ICD-10-CM

## 2022-06-24 DIAGNOSIS — E1122 Type 2 diabetes mellitus with diabetic chronic kidney disease: Secondary | ICD-10-CM

## 2022-06-24 DIAGNOSIS — Z9889 Other specified postprocedural states: Secondary | ICD-10-CM

## 2022-06-24 DIAGNOSIS — N289 Disorder of kidney and ureter, unspecified: Secondary | ICD-10-CM

## 2022-06-24 DIAGNOSIS — I4510 Unspecified right bundle-branch block: Secondary | ICD-10-CM

## 2022-06-24 DIAGNOSIS — E6609 Other obesity due to excess calories: Secondary | ICD-10-CM

## 2022-06-24 DIAGNOSIS — I10 Essential (primary) hypertension: Secondary | ICD-10-CM

## 2022-06-24 DIAGNOSIS — E669 Obesity, unspecified: Secondary | ICD-10-CM

## 2022-06-24 DIAGNOSIS — Z136 Encounter for screening for cardiovascular disorders: Secondary | ICD-10-CM

## 2022-06-24 DIAGNOSIS — N1832 Stage 3b chronic kidney disease (HCC): Secondary | ICD-10-CM

## 2022-06-24 DIAGNOSIS — W19XXXA Unspecified fall, initial encounter: Secondary | ICD-10-CM

## 2022-06-24 DIAGNOSIS — I4891 Unspecified atrial fibrillation: Secondary | ICD-10-CM

## 2022-06-24 DIAGNOSIS — I4819 Other persistent atrial fibrillation: Secondary | ICD-10-CM

## 2022-06-24 DIAGNOSIS — R609 Edema, unspecified: Secondary | ICD-10-CM

## 2022-06-24 DIAGNOSIS — R0789 Other chest pain: Secondary | ICD-10-CM

## 2022-06-24 DIAGNOSIS — Z8659 Personal history of other mental and behavioral disorders: Secondary | ICD-10-CM

## 2022-06-24 DIAGNOSIS — IMO0001 IDDM (insulin dependent diabetes mellitus): Secondary | ICD-10-CM

## 2022-06-24 MED ORDER — RIVAROXABAN 15 MG PO TAB
15 mg | ORAL_TABLET | Freq: Every day | ORAL | 1 refills | 30.00000 days | Status: AC
Start: 2022-06-24 — End: ?

## 2022-06-24 NOTE — Patient Instructions
Reduce Xarelto to 15mg  daily    Schedule Echo    Nephrology referral placed    Thank you so much for participating in shared decision making with Dr. Betti Cruz. We have attached the shared decision making tool discussed during your visit.     Tempie Hoist, RN is the Navigator for [employer] procedure. She will be in touch with you to introduce herself and schedule the remainder of your appointments as well as review your Watchman procedure plan. Feel free to send an email to initiate contact.     Her contact information is as follows:    Tempie Hoist, RN   Cardiac Navigator  [phone] office  [email or web address]    Feel free to reach out should you have questions.      Follow-Up:    -Thank you for allowing me to take care of you today. My name is Almyra Free, Charity fundraiser.     The schedule is released approximately 4-5 months in advance. You should be called or mailed to make an appointment, however if you would like to call us to make this appt, please call [phone].    -You will receive a survey in the upcoming week from [facility]. Your feedback is important to [place], and helps [place] continue to improve patient care and patient satisfaction.     Contacting our office:    -Business Hours: Monday-Friday, 8:00 am-4:30 pm (excluding Holidays).     -For medical questions or concerns, please send [place] a message through your MyChart account or call the [facility] nursing triage line at [phone]. Please leave a detailed message with your name, date of birth, and reason for your call.  If your message is received before 3:30pm, every effort will be made to call you back the same day.  Please allow time for [place] to review your chart prior to call back.     -For medication refills please start by contacting your pharmacy. You can also send [place] a prescription question through your MyChart or call the nurse triage line above.     -Should you have an immediate need of the weekend/nights and holidays, please call our on-call triage line at [phone].    -Our fax number is [phone].    Results & Testing Follow Up:    -Please allow 10-15 business days for the results of any testing to be reviewed. Please call our office if you have not heard from a nurse within this time frame.    -Should you choose to complete testing at an outside facility, please contact our office after completion of testing so that we can ensure that we have received results.    Lab and test results:  As a part of the CARES act, starting 04/06/2019, some results will be released to you via mychart immediately and automatically.  You may see results before your provider sees them; however, your provider will review all these results and then they, or one of their team, will notify you of result information and recommendations.   Critical results will be addressed immediately, but otherwise, please allow [place] time to get back with you prior to you reaching out to [place] for questions.  This will usually take about 72 hours for labs and 5-7 days for procedure test results.      We know you have a choice and want to thank you for choosing [facility].

## 2022-06-24 NOTE — Progress Notes
Date of Service: 06/24/2022    Tina Bush is a 72 y.o. female.       HPI  Tina Bush was seen in the office today in electrophysiology follow up. As you may know, she is a 72 y.o. female, with past medical history including hypertension, hyperlipidemia, diabetes mellitus type 2, obesity, right bundle branch block, chronic dependent edema and persistent atrial fibrillation.  She did undergo pulmonary vein isolation with Dr. Leeann Must in 2016 and did well post ablation without recurrence of arrhythmia until after dofetilide was discontinued.  She did require redo pulmonary vein isolation in 2017 and has done well from a rhythm standpoint since that time without antiarrhythmic medications.  She has been managed on Xarelto 20 mg daily with a CHA2DS2-VASc score of 4 (age, female, diabetes mellitus, hypertension).  She last saw Dr. Betti Cruz in 2021 and I last saw her in December 2022.  At that time she was doing well but had suffered a fall resulting in a nose fracture.  She has been lost to follow-up with [place] since that time and returns today for follow-up.    [name] presents today walking in with her walker.  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  She reports that she has had an unsteady gait with several falls.  Fortunately she has avoided serious injury.  She does have ecchymosis to extremities x 4.  She denies any melena or hematochezia.  She denies palpitations or fluttering.  She is unaware that she has returned to atrial fibrillation today.  She follows closely with her primary advanced practice provider, Tina Bush.  She continues to live independently and has a neighbor that helps to look after her.  She utilizes a walker for most of her daily activities.  She denies dyspnea or angina.    Labs:   06/08/22 00:00   Hemoglobin 11 (L) (E)   Hematocrit 32.6 (L) (E)   Platelet Count 139 (L) (E)   White Blood Cells 6.2 (E)   RBC 3.49 (L) (E)   MCV 93.4 (E)   MCH 31.5 (E)   MCHC 33.7 (E)   RDW 14.0 (E)   Sodium 141 (E)   Potassium 4.4 (E)   Chloride 103 (E)   CO2 29 (E)   Anion Gap * (E)   Blood Urea Nitrogen 38 (H) (E)   Creatinine 2.13 (H) (E)   eGFR Non African American 24 (L) (E)   eGFR African American * (E)   Glucose 96 (E)   Albumin 3.6 (E)   Calcium 10.0 (E)   Total Bilirubin 0.6 (E)   Total Protein 5.8 (L) (E)   AST (SGOT) 37 (H) (E)   ALT (SGPT) 39 (H) (E)   Alk Phosphatase 93 (E)   (L): Data is abnormally low  (H): Data is abnormally high  (E): External lab result    Vitals:    06/24/22 1409   BP: 134/78   BP Source: Arm, Right Upper   Pulse: 88   SpO2: 95%   PainSc: Zero   Weight: Comment: Wheel chair  says she weighed 220 last time a month ago   Height: 160 cm (5' 3)     Body mass index is 39.5 kg/m?[name]     Past Medical History  Patient Active Problem List    Diagnosis Date Noted    Primary hypertension 06/24/2022    RBBB 12/10/2020    S/P ablation of atrial  fibrillation 08/03/2018    Atrial fibrillation (HCC) 05/31/2015    Obesity 02/25/2012    Diabetes mellitus (HCC) 02/25/2012    Dyslipidemia 02/25/2012    Chest pressure 02/25/2012    Persistent atrial fibrillation (HCC) 02/25/2012         Review of Systems   Constitutional: Negative.   HENT: Negative.     Eyes: Negative.    Cardiovascular: Negative.    Respiratory: Negative.     Endocrine: Negative.    Hematologic/Lymphatic: Negative.    Skin: Negative.    Musculoskeletal:  Positive for falls.   Gastrointestinal: Negative.    Genitourinary: Negative.    Neurological: Negative.    Psychiatric/Behavioral: Negative.     Allergic/Immunologic: Negative.        Physical Exam   Constitutional: She appears well-developed.   Ill appearing, walks in with walker   HENT:   Head: Normocephalic and atraumatic.   Eyes: Conjunctivae are normal.   Cardiovascular: Normal rate and normal heart sounds. An irregular rhythm present.   Pulmonary/Chest: Effort normal and breath sounds normal.   Musculoskeletal:         General: Swelling present.      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   Skin: Skin is warm and dry.   Ecchymosis to BUE   Psychiatric: Her behavior is normal.     Cardiovascular Studies  ECG in office today shows atrial fibrillation with incomplete right bundle branch block.  QRS duration is near 120 ms.  Average ventricular rates today are near 88 bpm.    Cardiovascular Health Factors  Vitals BP Readings from Last 3 Encounters:   06/24/22 134/78   04/16/21 130/48   12/10/20 138/76     Wt Readings from Last 3 Encounters:   04/16/21 101.2 kg (223 lb)   12/10/20 100.2 kg (221 lb)   07/07/19 105.7 kg (233 lb)     BMI Readings from Last 3 Encounters:   06/24/22 39.50 kg/m?   04/16/21 40.79 kg/m?   12/10/20 39.15 kg/m?      Smoking Social History     Tobacco Use   Smoking Status Former    Current packs/day: 0.00    Average packs/day: 1 pack/day for 17.0 years (17.0 ttl pk-yrs)    Types: Cigarettes    Start date: 02/28/1987    Quit date: 02/28/2004    Years since quitting: 18.3    Passive exposure: Past   Smokeless Tobacco Never      Lipid Profile Cholesterol   Date Value Ref Range Status   10/22/2021 146  Final     HDL   Date Value Ref Range Status   10/22/2021 36 (L)  Final     LDL   Date Value Ref Range Status   10/22/2021 84  Final     Triglycerides   Date Value Ref Range Status   10/22/2021 154 (H)  Final      Blood Sugar Hemoglobin A1C   Date Value Ref Range Status   03/31/2017 5.5  Final     Glucose   Date Value Ref Range Status   06/08/2022 96  Final   07/19/2019 142 (H) 65 - 99 Final   07/22/2018 130 (H) 65 - 99 Final     Glucose, POC   Date Value Ref Range Status   06/01/2015 105 (H) 70 - 100 MG/DL Final   21/30/8657 846 (H) 70 -  100 MG/DL Final   16/10/9602 540 (H) 70 - 100 MG/DL Final          Problems Addressed Today  Encounter Diagnoses   Name Primary?    Screening for heart disease Yes    Persistent atrial fibrillation (HCC) RBBB     S/P ablation of atrial fibrillation     Type 2 diabetes mellitus with chronic kidney disease, without long-term current use of insulin, unspecified CKD stage (HCC)     Dyslipidemia     Class 1 obesity due to excess calories without serious comorbidity with body mass index (BMI) of 34.0 to 34.9 in adult     Primary hypertension        Assessment and Plan  Ms. Kriz presents in atrial fibrillation today.  We have not seen her in nearly 2 years.  She is unsure when she reverted back to atrial fibrillation and reports being entirely asymptomatic.  Her rates are currently controlled on atenolol.  I have not made adjustments to her beta-blockade today.  She is on Xarelto 20 mg daily and does report frequent falls as well as ecchymosis to extremities.  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  Creatinine clearance is near 37 and I have reduced her Xarelto to 15 mg daily.  I think she would benefit from left atrial appendage occlusion to be completed sooner rather than later given her risk for falls and bleeding.  I would like to follow-up with echocardiogram to reevaluate LV size, valvular function and left atrial size.  We may ultimately deem her atrial fibrillation permanent and manage with a rate control strategy in order to avoid recurrent cardioversions and antiarrhythmics given her decline in renal function.  I think she would benefit from establishing care with the nephrology team as well.    I will provide Dr. Betti Cruz with an update on her condition today.  We will plan to proceed with echocardiogram and Watchman procedure.  We will refer to nephrology and reduce her Xarelto dose for the time being.  She verbalized good understanding and agreement with this plan.    Tina Every, NP-C       Current Medications (including today's revisions)   allopurinol (ZYLOPRIM) 100 mg tablet Take one tablet by mouth daily.    ascorbic acid (VITAMIN C PO) Take 1,000 mg by mouth daily.    atenolol (TENORMIN) 25 mg tablet Take one tablet by mouth daily.    atorvastatin (LIPITOR) 40 mg tablet Take one tablet by mouth daily.    Biotin 10,000 mcg cap Take one hundred capsules by mouth daily.    brexpiprazole (REXULTI) 0.5 mg tablet Take two tablets by mouth daily.    CALCIUM CARBONATE/VITAMIN D2 (CALCIUM + VITAMIN D PO) Take 1 capsule by mouth daily.    CHOLEcalciferoL (vitamin D3) (DIALYVITE VITAMIN D) 125 mcg (5,000 unit) capsule Take one capsule by mouth daily.    COLLAGEN MISC Use  as directed.    denosumab (PROLIA) 60 mg/mL syrg Inject 1 mL under the skin Bush 180 days.    fluoxetine (PROZAC) 20 mg capsule Take two capsules by mouth daily.    furosemide (LASIX) 20 mg tablet Take one tablet by mouth Bush morning.    glipiZIDE (GLUCOTROL) 5 mg tablet Take one-half tablet by mouth daily.    hydroCHLOROthiazide (HYDRODIURIL) 12.5 mg tablet Take one tablet by mouth daily.    lisinopril (PRINIVIL, ZESTRIL) 40 mg tablet Take one tablet by  mouth daily.    loratadine (CLARITIN) 10 mg tablet Take one tablet by mouth daily as needed.    other medication Take one Dose by mouth once. Medication Name & Strength: [place]   Dose(how many): 1 cap   Frequency(how often): BID    rivaroxaban (XARELTO) 20 mg tablet Take one tablet by mouth at bedtime daily.    vitamin E acetate (VITAMIN E PO) Take 1 capsule by mouth daily.    vitamins, multiple tablet Take one tablet by mouth daily.

## 2022-06-25 ENCOUNTER — Encounter: Admit: 2022-06-25 | Discharge: 2022-06-25 | Payer: MEDICARE | Primary: Family

## 2022-06-26 ENCOUNTER — Encounter: Admit: 2022-06-26 | Discharge: 2022-06-26 | Payer: MEDICARE | Primary: Family

## 2022-07-01 ENCOUNTER — Encounter: Admit: 2022-07-01 | Discharge: 2022-07-01 | Payer: MEDICARE | Primary: Family

## 2022-07-10 ENCOUNTER — Encounter: Admit: 2022-07-10 | Discharge: 2022-07-10 | Payer: MEDICARE | Primary: Family

## 2022-07-15 ENCOUNTER — Encounter: Admit: 2022-07-15 | Discharge: 2022-07-15 | Payer: MEDICARE | Primary: Family

## 2022-07-23 ENCOUNTER — Encounter: Admit: 2022-07-23 | Discharge: 2022-07-23 | Payer: MEDICARE | Primary: Family

## 2022-09-23 ENCOUNTER — Encounter: Admit: 2022-09-23 | Discharge: 2022-09-23 | Payer: MEDICARE | Primary: Family

## 2023-12-07 IMAGING — CR HIPCMLT
3 series · 3 of 3 positions shown · non-contrast
Comparison: none

[hip ap pelvis]
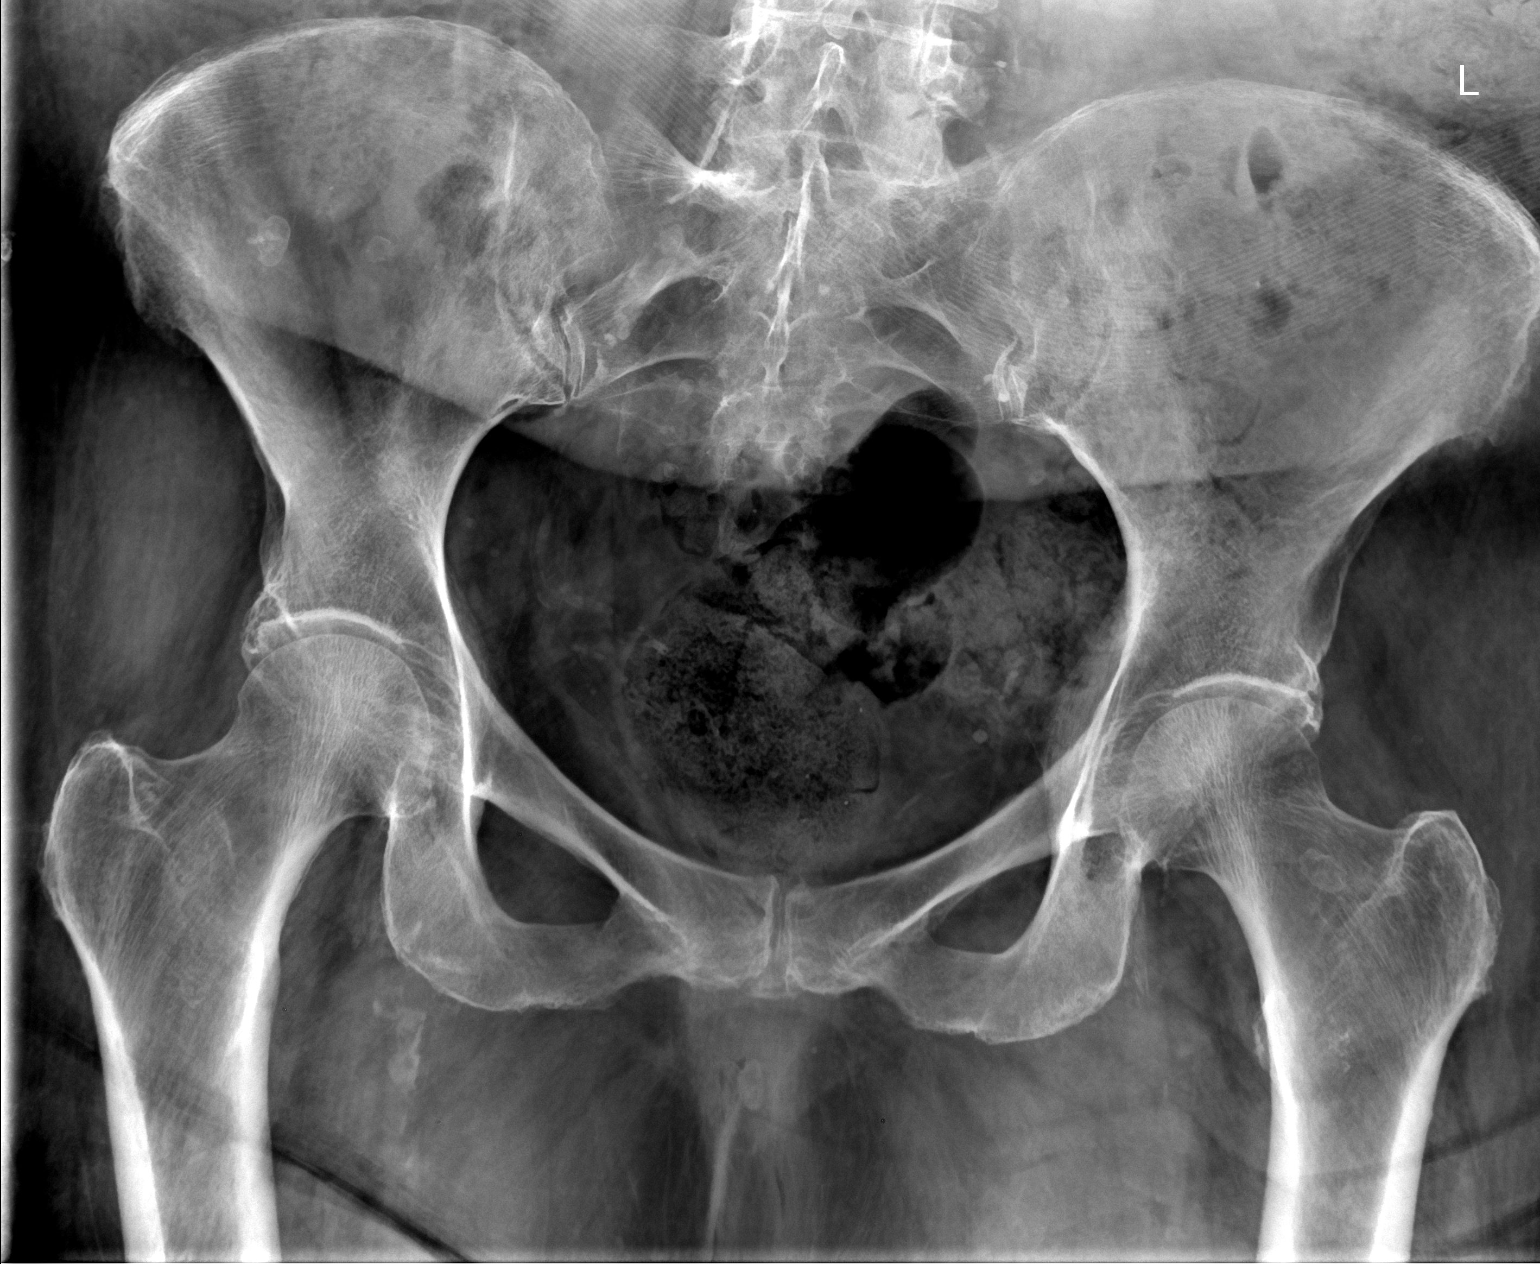

[hip ap]
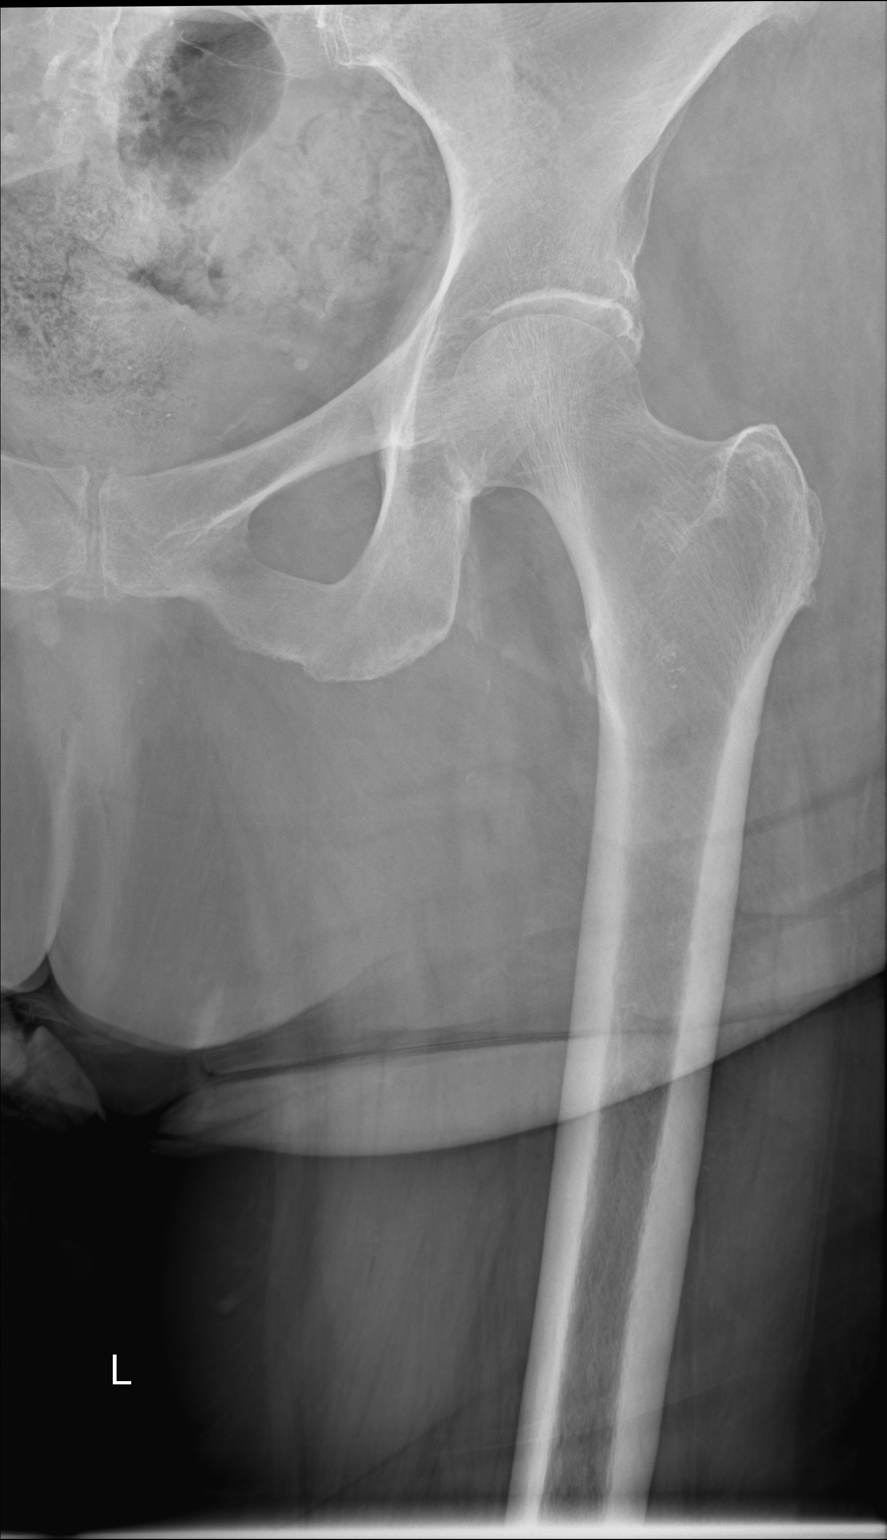

[hip frog lat]
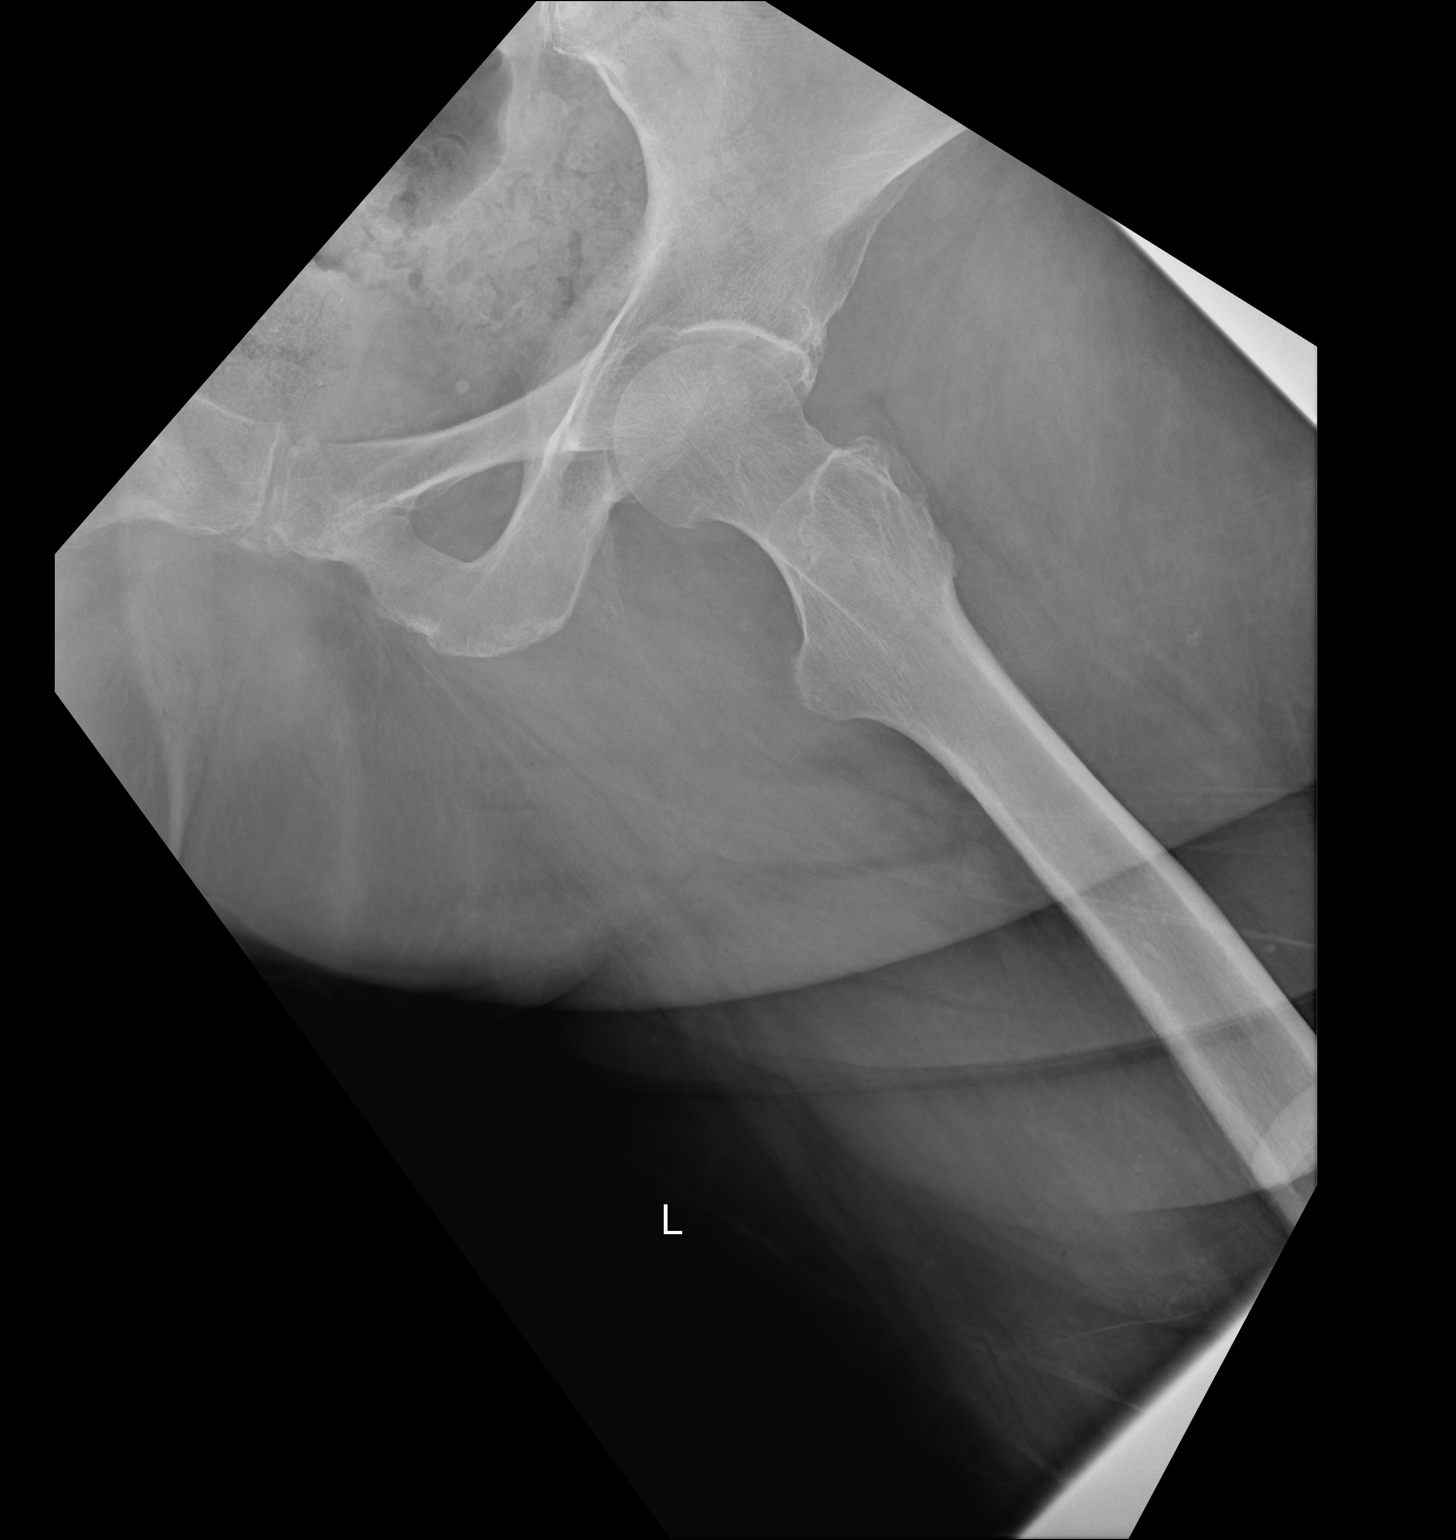

[3 of 3 positions shown; findings below may reference images not displayed]

EXAM

XR hip LT, 2-3V w or wo pelvis

INDICATION

left hip pain
frequent falls. fall few months ago. increasing pain

TECHNIQUE

Three views of the left hip

COMPARISONS

None available at the time of dictation.

FINDINGS

Mild-to-moderate bilateral superior medial hip joint space loss. Mild degenerative change of the
pubic symphysis. No radiographic evidence of an acute fracture, osseous malalignment, or aggressive
focal osseous lesion. There are vascular calcifications.

IMPRESSION
1. Mild-to-moderate bilateral superior medial hip joint space loss.

Tech Notes:

frequent falls. fall few months ago. increasing pain
